# Patient Record
Sex: Male | Born: 1960 | Race: White | Hispanic: No | Marital: Married
Health system: Southern US, Community
[De-identification: ages and names within clinical notes are randomized; demographics above are authoritative.]

---

## 2007-06-15 ENCOUNTER — Ambulatory Visit: Payer: Self-pay | Admitting: Family Medicine

## 2008-04-04 ENCOUNTER — Ambulatory Visit: Payer: Self-pay | Admitting: Family Medicine

## 2011-12-16 ENCOUNTER — Emergency Department: Payer: Self-pay | Admitting: Emergency Medicine

## 2011-12-16 LAB — CBC
HGB: 15 g/dL (ref 13.0–18.0)
MCH: 31.6 pg (ref 26.0–34.0)
MCV: 95 fL (ref 80–100)
Platelet: 280 10*3/uL (ref 150–440)
RDW: 12.8 % (ref 11.5–14.5)

## 2011-12-16 LAB — COMPREHENSIVE METABOLIC PANEL
Albumin: 4.1 g/dL (ref 3.4–5.0)
Alkaline Phosphatase: 76 U/L (ref 50–136)
BUN: 11 mg/dL (ref 7–18)
Bilirubin,Total: 0.7 mg/dL (ref 0.2–1.0)
Calcium, Total: 8.7 mg/dL (ref 8.5–10.1)
Co2: 23 mmol/L (ref 21–32)
Creatinine: 0.68 mg/dL (ref 0.60–1.30)
EGFR (Non-African Amer.): >60
Glucose: 106 mg/dL — ABNORMAL HIGH (ref 65–99)
Potassium: 3.8 mmol/L (ref 3.5–5.1)
SGPT (ALT): 35 U/L
Total Protein: 7.7 g/dL (ref 6.4–8.2)

## 2011-12-16 LAB — TSH: Thyroid Stimulating Horm: 1.41 u[IU]/mL

## 2011-12-16 LAB — ACETAMINOPHEN LEVEL: Acetaminophen: <2 ug/mL

## 2012-03-17 LAB — CBC
HCT: 43 % (ref 40.0–52.0)
HGB: 15.1 g/dL (ref 13.0–18.0)
MCHC: 35.1 g/dL (ref 32.0–36.0)
MCV: 92 fL (ref 80–100)
WBC: 9.9 10*3/uL (ref 3.8–10.6)

## 2012-03-17 LAB — ACETAMINOPHEN LEVEL: Acetaminophen: <2 ug/mL

## 2012-03-17 LAB — COMPREHENSIVE METABOLIC PANEL
Alkaline Phosphatase: 74 U/L (ref 50–136)
Anion Gap: 10 (ref 7–16)
Bilirubin,Total: 0.4 mg/dL (ref 0.2–1.0)
Chloride: 102 mmol/L (ref 98–107)
Co2: 23 mmol/L (ref 21–32)
Creatinine: 0.93 mg/dL (ref 0.60–1.30)
EGFR (African American): >60
EGFR (Non-African Amer.): >60
Glucose: 95 mg/dL (ref 65–99)
Osmolality: 272 (ref 275–301)
Potassium: 4 mmol/L (ref 3.5–5.1)
Sodium: 135 mmol/L — ABNORMAL LOW (ref 136–145)

## 2012-03-17 LAB — URINALYSIS, COMPLETE
Bilirubin,UR: NEGATIVE
Leukocyte Esterase: NEGATIVE
Protein: NEGATIVE
RBC,UR: 1 /HPF (ref 0–5)
Squamous Epithelial: NONE SEEN
WBC UR: NONE SEEN /HPF (ref 0–5)

## 2012-03-17 LAB — DRUG SCREEN, URINE
Barbiturates, Ur Screen: NEGATIVE (ref ?–200)
Benzodiazepine, Ur Scrn: NEGATIVE (ref ?–200)
Cannabinoid 50 Ng, Ur ~~LOC~~: NEGATIVE (ref ?–50)
MDMA (Ecstasy)Ur Screen: POSITIVE (ref ?–500)
Phencyclidine (PCP) Ur S: NEGATIVE (ref ?–25)
Tricyclic, Ur Screen: POSITIVE (ref ?–1000)

## 2012-03-17 LAB — ETHANOL
Ethanol %: <0.003 % (ref 0.000–0.080)
Ethanol: <3 mg/dL

## 2012-03-17 LAB — TSH: Thyroid Stimulating Horm: 1.73 u[IU]/mL

## 2012-03-17 LAB — SALICYLATE LEVEL: Salicylates, Serum: 3 mg/dL — ABNORMAL HIGH

## 2012-03-18 ENCOUNTER — Inpatient Hospital Stay: Payer: Self-pay | Admitting: Psychiatry

## 2012-03-19 LAB — BASIC METABOLIC PANEL
Anion Gap: 11 (ref 7–16)
BUN: 13 mg/dL (ref 7–18)
Calcium, Total: 9 mg/dL (ref 8.5–10.1)
EGFR (African American): >60
EGFR (Non-African Amer.): >60
Glucose: 99 mg/dL (ref 65–99)
Osmolality: 278 (ref 275–301)

## 2012-03-21 LAB — LIPID PANEL
Cholesterol: 177 mg/dL (ref 0–200)
Ldl Cholesterol, Calc: 117 mg/dL — ABNORMAL HIGH (ref 0–100)
Triglycerides: 128 mg/dL (ref 0–200)
VLDL Cholesterol, Calc: 26 mg/dL (ref 5–40)

## 2013-06-19 IMAGING — CT CT HEAD WITHOUT CONTRAST
2 series · 16 of 30 positions shown, 20 images · non-contrast
Comparison: none

REASON FOR EXAM: altered mental status
COMMENTS:

PROCEDURE:     CT  - CT HEAD WITHOUT CONTRAST  - December 16, 2011  [DATE]
RESULT:     Comparison:  None
TECHNIQUE: Multiple axial images from the foramen magnum to the vertex were
obtained without IV contrast.

[Series 2: without · axial · non-contrast · 0.45mm/px · z∈[+866,+1000]mm · 13 of 33 slices shown, 17 images]
[im 3/33  brain]
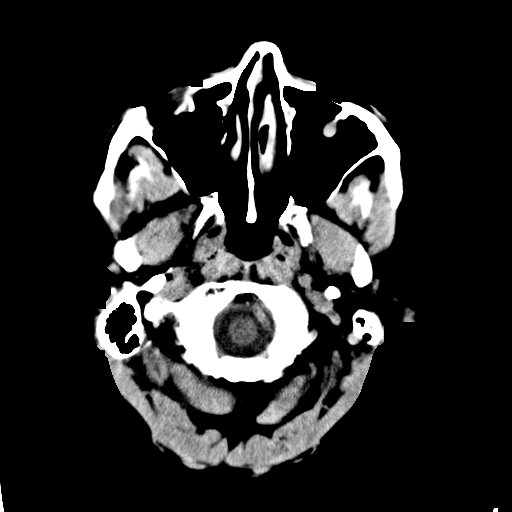
[im 3/33  bone]
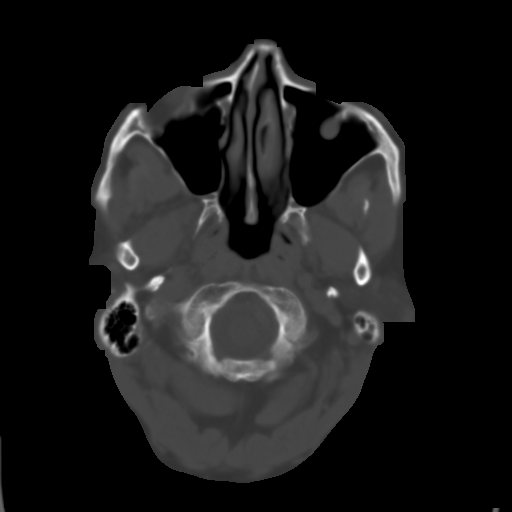
[im 5/33  brain]
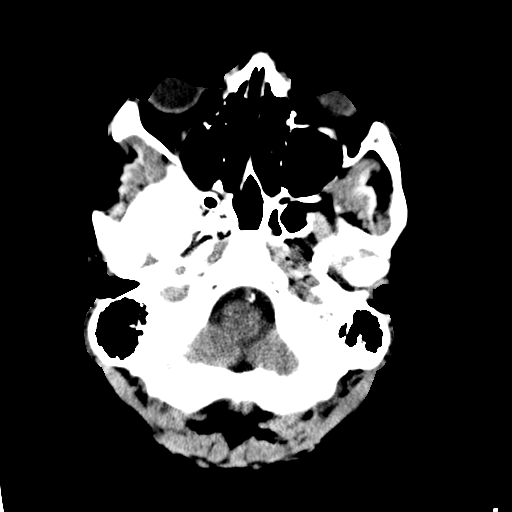
[im 7/33  brain]
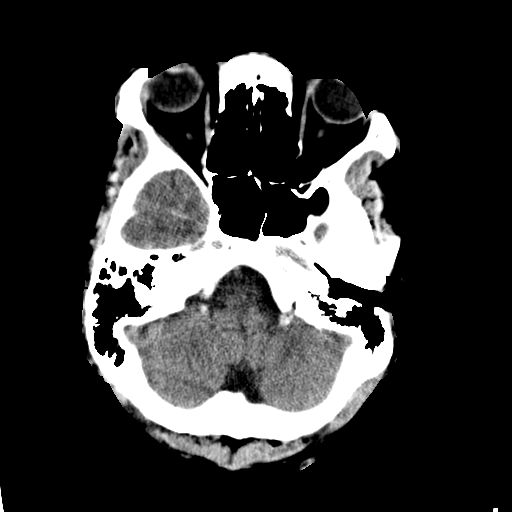
[im 10/33  brain]
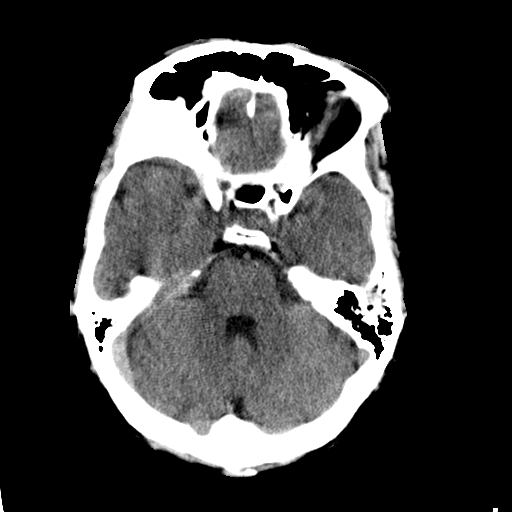
[im 12/33  brain]
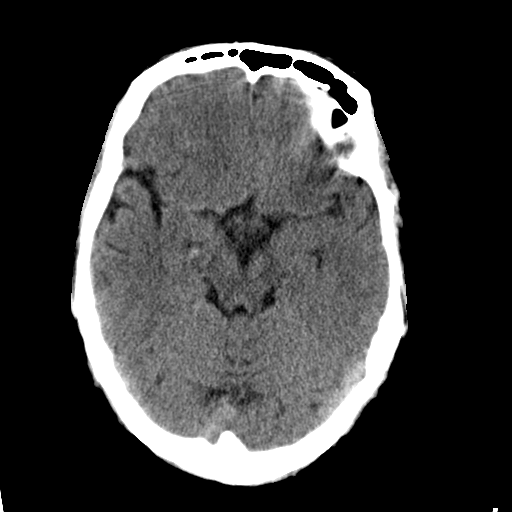
[im 12/33  bone]
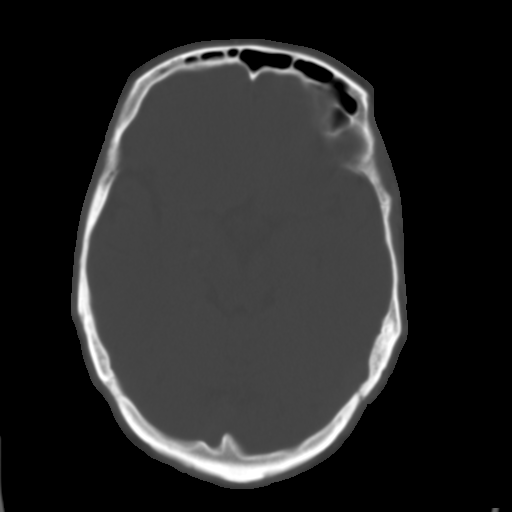
[im 14/33  brain]
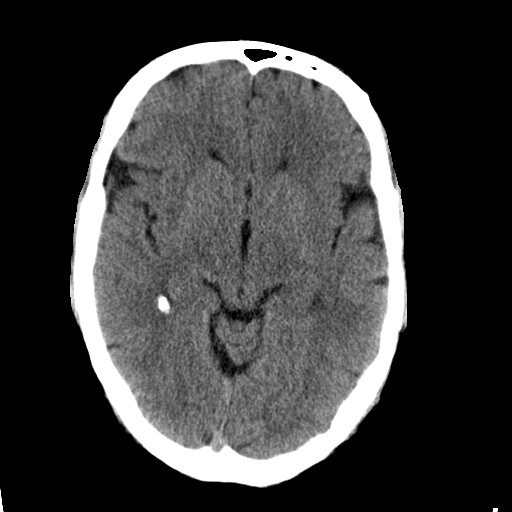
[im 17/33  brain]
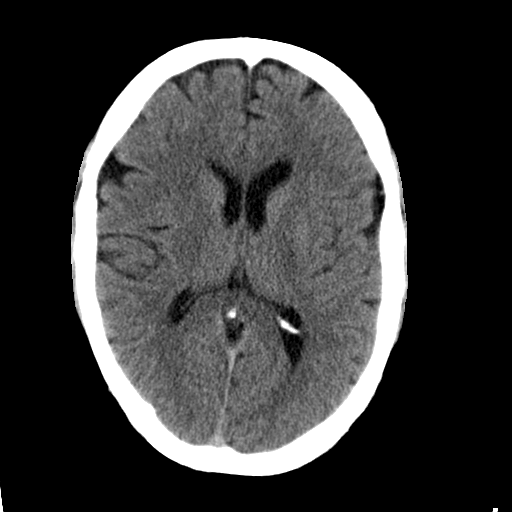
[im 19/33  brain]
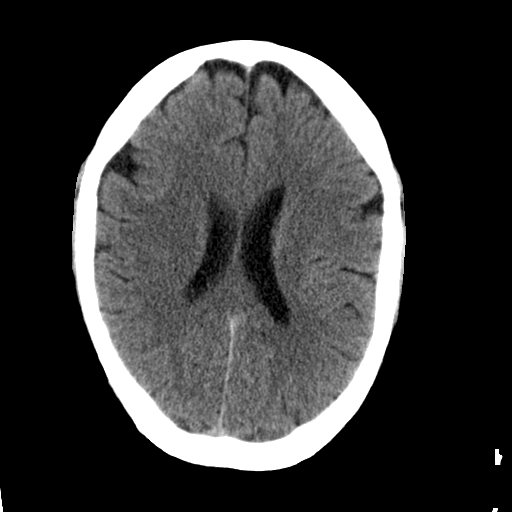
[im 21/33  brain]
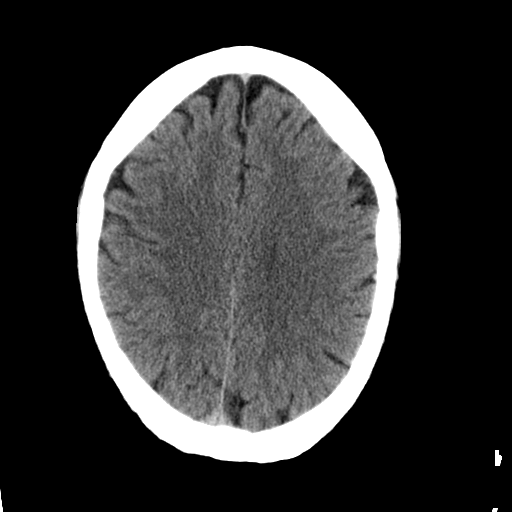
[im 21/33  bone]
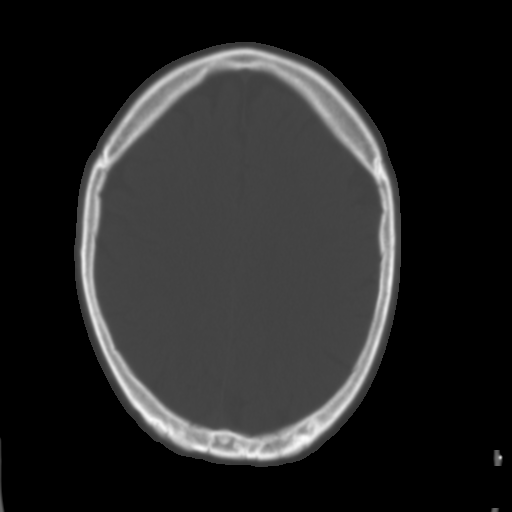
[im 23/33  brain]
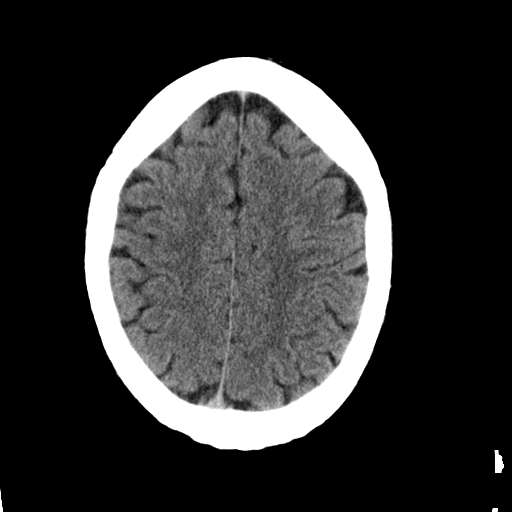
[im 26/33  brain]
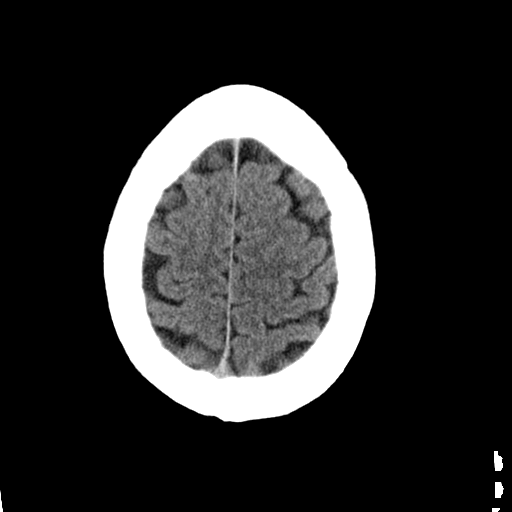
[im 28/33  brain]
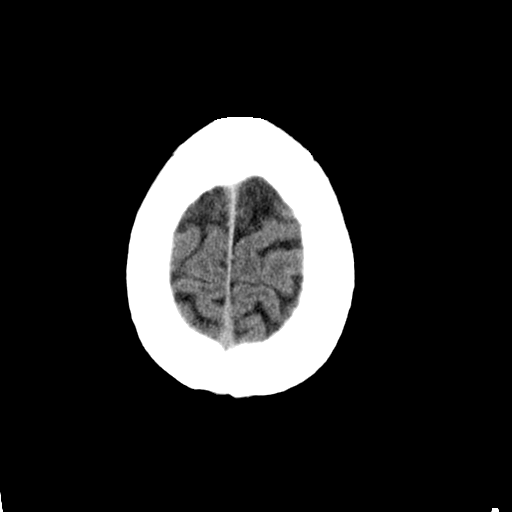
[im 30/33  brain]
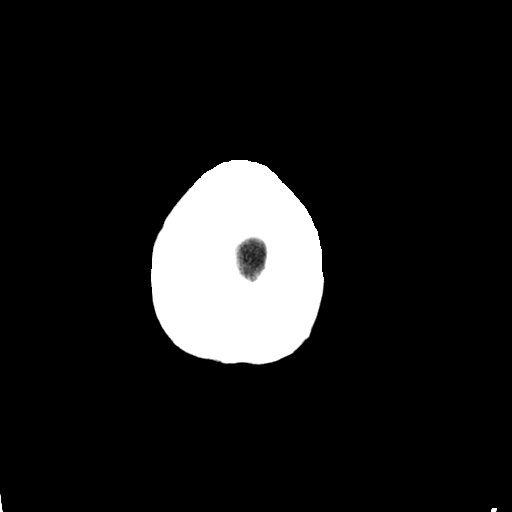
[im 30/33  bone]
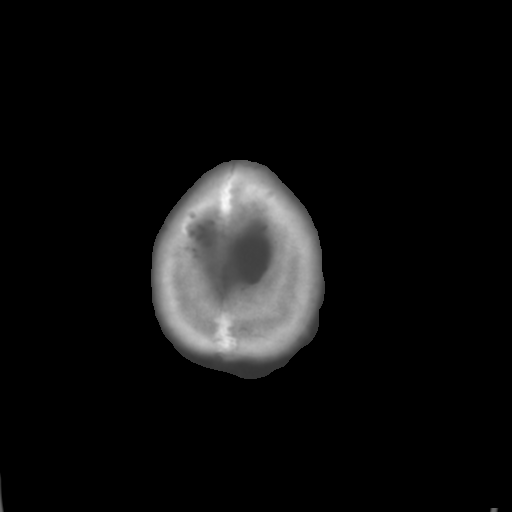

[Series 3: bone · axial · 0.45mm/px · z∈[+866,+910]mm · 3 of 33 slices shown]
[im 3/33  bone]
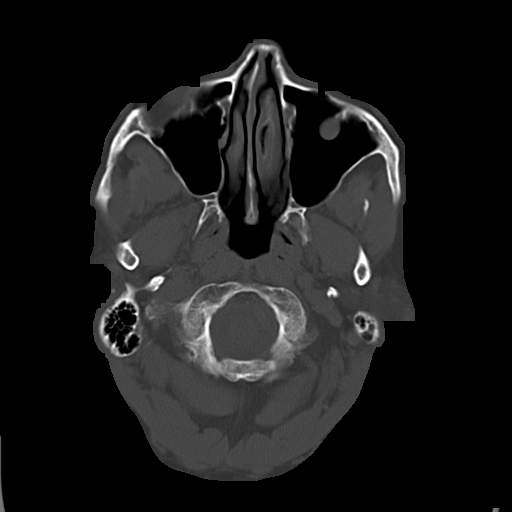
[im 7/33  bone]
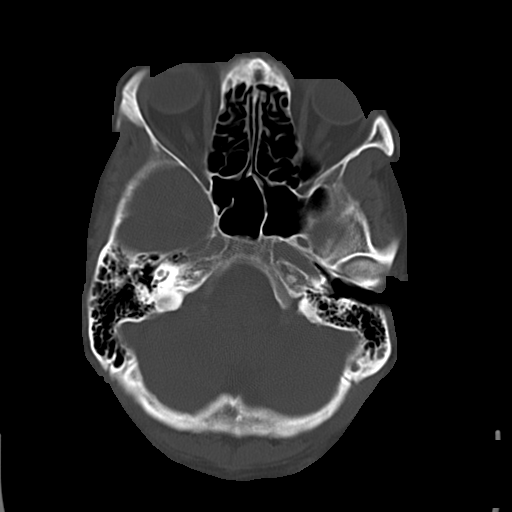
[im 12/33  bone]
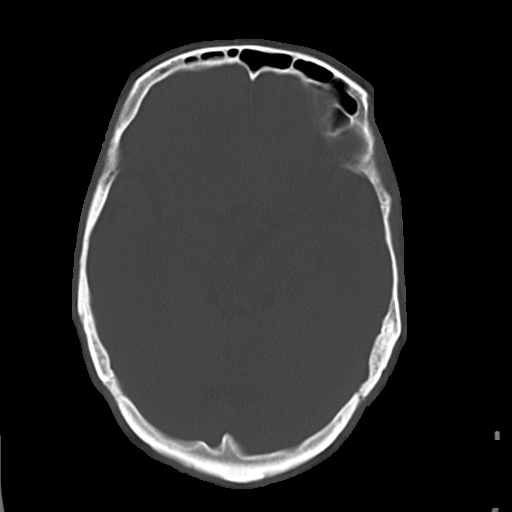

[16 of 30 positions shown; findings below may reference images not displayed]

FINDINGS: There is no evidence of mass effect, midline shift, or extra-axial fluid
collections.  There is no evidence of a space-occupying lesion or
intracranial hemorrhage. There is no evidence of a cortical-based area of
acute infarction.

The ventricles and sulci are appropriate for the patient's age. The basal
cisterns are patent.

Visualized portions of the orbits are unremarkable. The visualized portions
of the paranasal sinuses and mastoid air cells are unremarkable.

The osseous structures are unremarkable.
IMPRESSION: No acute intracranial process.

[REDACTED]

## 2014-10-16 NOTE — Discharge Summary (Signed)
PATIENT NAME:  Jay Miller, Jay Miller MR#:  [id number] DATE OF BIRTH:  04-Mar-1961  DATE OF ADMISSION:  03/18/2012 DATE OF DISCHARGE:  03/26/2012  HOSPITAL COURSE: The patient was admitted to the hospital voluntarily after presenting in referral from residential treatment services. He had presented there for substance abuse detox but they found him to be confused, possibly having psychotic symptoms, very depressed and possibly having delirium tremens and referred him to our facility. The patient did appear to have Miller significant problem with withdrawal from alcohol and benzodiazepines. He was treated with the CIWA protocol had had several days of shakiness. Some of his confusion may have been the result of delirium tremens. Ultimately it became more clear from interviewing the wife and the patient as well as observing his course that he had underlying psychiatric illness in addition to his substance abuse. The history was consistent with probably Miller severe depression with psychotic features, possibly also Miller long-term anxiety disorder. The patient remained isolated, did not participate in groups very much especially early in his hospital stay. He was able to describe having ideas of reference and paranoia frequently. He denied having any suicidal intent, although admitted that he had had thoughts in the past of suicidal actions. He was able to express not wanting to hurt himself because of his family. The patient was treated with antidepressants including citalopram and Risperdal. He has tolerated these medications well without any side effects. He also has been given p.r.n. hydroxyzine and trazodone for anxiety and sleep respectively. He has tolerated these medicines well. The patient showed some significant improvement towards the end of his hospital stay. He remains anxious and somewhat withdrawn but he is much more lucid and no longer endorsing any active psychotic symptoms. He has been able to interact with others in  an appropriate manner. He denies any suicidal thoughts. I had brought up with the patient the possibility of ECT treatment for his depression. The patient himself initially was interested in the treatment but when he discussed it with his wife she became very agitated. She appears to have Miller strong bias against the treatment. After that the patient requested discharge from the hospital formally. He is not currently meeting commitment criteria. He is not acutely endorsing suicidal ideation and he is not acutely behaving dangerously nor is he having active alcohol withdrawal. He is agreeable to an outpatient treatment plan including outpatient substance abuse and psychiatric treatment as well as involvement in Alcoholics Anonymous. Because of this, he will be discharged home after review of all of his medicines and the treatment plan.   LABORATORY RESULTS: Admission laboratory showed Miller drug screen positive for MDMA, not positive for benzodiazepines. TSH 1.73. Alcohol not detectable. Chemistry showed potassium low at 135, BUN high at 119. No other significant abnormalities. The CBC was entirely normal. The urinalysis was unremarkable. Acetaminophen normal. Salicylates slightly high at 3.0. Follow-up chemistry panels were repeated and showed normal basic chemistries. Lipid panel showed Miller total cholesterol of 177, triglycerides 128, HDL 34 which is low, LDL 117 which is high.   DISCHARGE MEDICATIONS:  1. Labetalol 100 mg b.i.d.  2. Altace 10 mg per day.  3. Trazodone 100 mg at night as needed for sleep.  4. Risperdal 1 mg in the morning and 2 mg at bedtime.  5. Atarax 50 mg q.6 hours p.r.n. for anxiety.  6. Citalopram 40 mg per day.   DISCHARGE MENTAL STATUS EXAMINATION: Neatly dressed and groomed man. Looks his stated age. Cooperative with the  interview. Eye contact improved. Psychomotor activity still slow but not very abnormal. Affect blunted but not as much as when he came in. Speech is Miller little bit quiet  but understandable. Thoughts were lucid with no delusional thinking or loosening of associations evident. He denies any auditory hallucinations or ideas of reference. No longer endorses feeling paranoid. Denies any suicidal or homicidal ideation. Intelligence normal. Short and long-term memory grossly intact. Insight and judgment improved.   DISPOSITION: Discharge follow-up appointments with [facility] and also encouraged to be involved in Alcoholic Anonymous.   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   SECONDARY DIAGNOSES:  AXIS I:   1. Alcohol dependence.  2. Benzodiazepine abuse.   AXIS II: Deferred.   AXIS III: Hypertension.   AXIS IV: Moderate to severe stress from limited social resources, financial difficulty, chronicity of illness.   AXIS V: Functioning at time of discharge 60.   ____________________________ Audery Amel, MD jtc:drc D: 03/26/2012 12:45:40 ET T: 03/27/2012 15:13:34 ET JOB#: [id number]  cc: Audery Amel, MD, <Dictator> Audery Amel MD ELECTRONICALLY SIGNED 03/27/2012 16:54

## 2014-10-16 NOTE — H&P (Signed)
PATIENT NAME:  Jay Miller, Jay Miller MR#:  [id number] DATE OF BIRTH:  1960-10-07  DATE OF ADMISSION:  03/18/2012  IDENTIFYING INFORMATION AND CHIEF COMPLAINT: This is a 54 year old man brought to the Emergency Room by his family because of concerns about worsening agitation, anxiety and confusion. He had originally apparently been referred to [facility], but was thought to be in delirium tremens there and was sent back to [place].   CHIEF COMPLAINT: I do not think any of this is real.   HISTORY OF PRESENT ILLNESS: Information obtained from the patient, from the chart and from some information from his family. The patient reports that he has not been feeling right for many months. He describes a constellation of symptoms including feeling like his mind is racing constantly, feeling guilty, feeling negative about himself and down all of the time. Also, says that he has been having difficulty sleeping, often sleeping only a couple of hours a night. He has been having what might be hallucinations, but may just be intense abnormal perceptions. He does, however, describe that recently he started to feel like things around him are not real and that maybe people are trying to arrest him and take him to jail for something. He denies that he has had any suicidal ideation. He admits that he has been drinking alcohol about 10 to 12 beers a day and has been doing so for years and years. He says recently he tried to cut back on it. He had been prescribed Ativan at a standing dose of 1 mg 4 times a day by his outpatient doctor, Dr. Quillian Quince. He says that this summer he went through a spell in which he thinks that he overused it. He says that his doctor then wanted him to come off of it and used Xanax to help him taper off, so he has been off benzodiazepines for about three weeks. He denies any homicidal ideation. He denies that he is abusing any other drugs. He feels like he has had a lot of stress in his life, but it sounds like most of it has  been more chronic long term stress or events that happened in the past, such as his brother's suicide which happened over 10 years ago. He has been prescribed Seroquel and Effexor by his doctor and has been taking it for a few weeks. He is not aware that it has made any difference.   PAST PSYCHIATRIC HISTORY: He denies that he has ever been in a psychiatric hospital. Denies any history of suicide attempts. Denies any history of mental health treatment, specifically other than the medicine that he is mentioning. Says that he has had an alcohol problem for a long time, but that for many years apparently he was pretty functional with it. Denies ever having had delirium tremens in the past.   PAST MEDICAL HISTORY: The patient has high blood pressure. Denies any history of heart disease or kidney disease or stroke. No diabetes.   FAMILY HISTORY: He had a brother who committed suicide over 10 years ago. His brother was also abusing alcohol, but probably had other mental health problems as well. He also describes his mother as being someone who had nervous breakdowns.   SOCIAL HISTORY: The patient lives with his wife and two teenage children. The patient does not work. Says that he has never had an official job or at least not for many years. 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 Recently, he said he has been feeling negative around the house like people are looking down on him or trying to hurt him, although he says when he thinks about it rationally, he knows that does not make sense.   SUBSTANCE ABUSE HISTORY: As noted above, long history of overuse of alcohol but apparently not seen as an active problem for much of that time. No history of delirium tremens. This summer was overusing the Ativan. Denies other history of drug abuse.   REVIEW OF SYSTEMS: Complains of feeling nervous, feeling like his mind is racing, feeling worried about things.  Also, says that he feels like things are not real around him. He thinks that maybe somebody is going to put him in jail. Denies suicidal ideation. Denies current hallucinations. No other physical symptoms right now.   MENTAL STATUS EXAM: The patient was interviewed in the Emergency Room and was cooperative and appropriate during the interview. He made pretty good eye contact. Psychomotor activity was decreased. He mostly sat with his hands in his lap looking withdrawn. Affect was flat and then a little confused. Mood was stated as being nervous. Thoughts were a little bit confused at times, not grossly bizarre. No obvious bizarre delusions, although he does say that he feels like things around him are unreal. He maintains some insight into this being a strange thought. He currently seems to have good judgment. Normal intelligence. Did not do formal testing, but his long-term memory at least seems to be intact. Denies suicidal or homicidal ideation. Denies any current auditory or visual or tactile hallucinations.   PHYSICAL EXAMINATION:  GENERAL: The patient looks like he is feeling a little cold and anxious, but otherwise not in any immediate physical distress. There were no acute skin lesions identified.   HEENT: Pupils are equal and reactive. Face is symmetric. Oral mucosa normal.  NECK AND BACK: Nontender.   MUSCULOSKELETAL: Full range of motion at all extremities. Normal gait.   NEUROLOGIC: Cranial nerves intact and symmetric. Strength and reflexes are intact and symmetric.   LUNGS: Clear to auscultation with no wheezes.   HEART: Regular rate and rhythm with normal sounds.   ABDOMEN: Soft, nontender, normal bowel sounds. Pulse is 80. Respirations 18, blood pressure 176/91, temperature 97.   ASSESSMENT: 54 year old man with alcohol dependence and recent abuse, probably of benzodiazepines, who was probably going through some withdrawal still from both of them. However, he is also complaining  of multiple mood and anxiety complaints and some psychotic-like complaints. Most likely, these have been exacerbated by the alcohol, but seemed to probably be a separate problem as well. Possibly, a major depression with psychosis. Possibly, an anxiety disorder, even possibly a psychotic disorder. Needs hospitalization both for alcohol detox and for stabilization of mental health condition.   TREATMENT PLAN: Admit to psychiatry. Continue his Altace, labetalol and p.r.n. Seroquel. Trazodone p.r.n. at night for sleep. Effexor will be discontinued because it raises his blood pressure and I will put him on Celexa 20 mg a day. Try to get collateral history. Monitor vital signs. I am going to put him on the alcohol withdrawal CIWA protocol orders. Engage the patient in groups and activities for monitoring and assessment on the unit.   DIAGNOSIS PRINCIPLE AND PRIMARY:  AXIS I: Alcohol dependence.   SECONDARY DIAGNOSES:  AXIS I:  1. Depression, not otherwise specified.  2. Benzodiazepine abuse in early remission.   AXIS III: High blood pressure.   AXIS IV: Moderate stress just from  burden of illness and drinking.   AXIS V: Functioning at time of evaluation 40.  ____________________________ Audery Amel, MD jtc:ap D: 03/18/2012 13:07:03 ET T: 03/18/2012 13:29:16 ET JOB#: [id number]  cc: Audery Amel, MD, <Dictator> Audery Amel MD ELECTRONICALLY SIGNED 03/18/2012 16:40

## 2014-10-24 ENCOUNTER — Emergency Department
Admit: 2014-10-24 | Disposition: A | Discharge status: Home / Self Care | Payer: Self-pay | Admitting: Emergency Medicine
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
## 2019-04-22 ENCOUNTER — Other Ambulatory Visit: Payer: Self-pay

## 2019-04-22 DIAGNOSIS — Z20822 Contact with and (suspected) exposure to covid-19: Secondary | ICD-10-CM

## 2019-04-23 LAB — NOVEL CORONAVIRUS, NAA: SARS-CoV-2, NAA: NOT DETECTED

## 2019-09-22 ENCOUNTER — Ambulatory Visit: Payer: Self-pay

## 2019-09-24 ENCOUNTER — Ambulatory Visit: Payer: Self-pay

## 2019-09-25 ENCOUNTER — Ambulatory Visit: Payer: Self-pay | Attending: Internal Medicine

## 2019-09-25 DIAGNOSIS — Z23 Encounter for immunization: Secondary | ICD-10-CM

## 2019-09-25 NOTE — Progress Notes (Signed)
   Covid-19 Vaccination Clinic  Name:  Jay Miller    MRN: [id number] DOB: 1960-08-23  09/25/2019  [name] was observed post Covid-19 immunization for 15 minutes without incident. He was provided with Vaccine Information Sheet and instruction to access the V-Safe system.   [name] was instructed to call 911 with any severe reactions post vaccine: Marland Kitchen Difficulty breathing  . Swelling of face and throat  . A fast heartbeat  . A bad rash all over body  . Dizziness and weakness   Immunizations Administered    Name Date Dose VIS Date Route   Pfizer COVID-19 Vaccine 09/25/2019  3:32 PM 0.3 mL 06/09/2019 Intramuscular   Manufacturer: [employer], [employer]   Lot: [id number]   NDC: [address]-3

## 2019-10-16 ENCOUNTER — Ambulatory Visit: Payer: Self-pay | Attending: Internal Medicine

## 2019-10-16 DIAGNOSIS — Z23 Encounter for immunization: Secondary | ICD-10-CM

## 2019-10-16 NOTE — Progress Notes (Signed)
   Covid-19 Vaccination Clinic  Name:  Jay Miller    MRN: [id number] DOB: 13-Jul-1960  10/16/2019  Jay Miller was observed post Covid-19 immunization for 15 minutes without incident. He was provided with Vaccine Information Sheet and instruction to access the V-Safe system.   Jay Miller was instructed to call 911 with any severe reactions post vaccine: Marland Kitchen Difficulty breathing  . Swelling of face and throat  . A fast heartbeat  . A bad rash all over body  . Dizziness and weakness   Immunizations Administered    Name Date Dose VIS Date Route   Pfizer COVID-19 Vaccine 10/16/2019  3:13 PM 0.3 mL 08/23/2018 Intramuscular   Manufacturer: [employer], [employer]   Lot: [id number]   NDC: [address]-6

## 2023-02-12 ENCOUNTER — Other Ambulatory Visit: Payer: Self-pay | Admitting: Family Medicine

## 2023-02-12 ENCOUNTER — Encounter: Payer: Self-pay | Admitting: Family Medicine

## 2023-02-12 DIAGNOSIS — M545 Low back pain, unspecified: Secondary | ICD-10-CM

## 2023-02-17 ENCOUNTER — Ambulatory Visit: Payer: BC Managed Care – PPO

## 2023-10-07 ENCOUNTER — Emergency Department (HOSPITAL_COMMUNITY): Payer: Self-pay

## 2023-10-07 ENCOUNTER — Emergency Department (HOSPITAL_COMMUNITY)
Admission: EM | Admit: 2023-10-07 | Discharge: 2023-10-07 | Disposition: A | Discharge status: Home / Self Care | Payer: Self-pay | Attending: Emergency Medicine | Admitting: Emergency Medicine

## 2023-10-07 DIAGNOSIS — S299XXA Unspecified injury of thorax, initial encounter: Secondary | ICD-10-CM | POA: Diagnosis present

## 2023-10-07 DIAGNOSIS — S0083XA Contusion of other part of head, initial encounter: Secondary | ICD-10-CM | POA: Diagnosis not present

## 2023-10-07 DIAGNOSIS — S00531A Contusion of lip, initial encounter: Secondary | ICD-10-CM | POA: Diagnosis not present

## 2023-10-07 DIAGNOSIS — S2242XA Multiple fractures of ribs, left side, initial encounter for closed fracture: Secondary | ICD-10-CM | POA: Diagnosis not present

## 2023-10-07 DIAGNOSIS — Z7901 Long term (current) use of anticoagulants: Secondary | ICD-10-CM | POA: Diagnosis not present

## 2023-10-07 MED ORDER — MORPHINE SULFATE (PF) 4 MG/ML IV SOLN
4.0000 mg | Freq: Once | INTRAVENOUS | Status: AC
  Administered 2023-10-07: 4 mg via INTRAVENOUS
  Filled 2023-10-07: qty 1

## 2023-10-07 MED ORDER — OXYCODONE-ACETAMINOPHEN 5-325 MG PO TABS: 1.0000 | ORAL_TABLET | ORAL | 0 refills | Status: AC | PRN

## 2023-10-07 NOTE — Discharge Instructions (Addendum)
 Return if any problems.  See your Physician for recheck in 3-4 days  ?
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
## 2023-10-07 NOTE — ED Notes (Signed)
 Pt walked out of ems bay when doors opened. Pt stopped and told that he needs his IV taken out if he wants to leave. Pt came back into ED and willing to stay at this time.
